# Patient Record
Sex: Female | Born: 1970 | Race: White | Hispanic: No | Marital: Married | Smoking: Former smoker
Health system: Southern US, Community
[De-identification: ages and names within clinical notes are randomized; demographics above are authoritative.]

## PROBLEM LIST (undated history)

## (undated) HISTORY — PX: CHOLECYSTECTOMY: SHX55

## (undated) HISTORY — PX: COSMETIC SURGERY: SHX468

---

## 2010-01-13 ENCOUNTER — Ambulatory Visit: Admission: RE | Admit: 2010-01-13 | Discharge: 2010-01-13 | Payer: Self-pay | Admitting: Family Medicine

## 2010-01-13 ENCOUNTER — Ambulatory Visit: Payer: Self-pay | Admitting: Vascular Surgery

## 2010-01-13 ENCOUNTER — Encounter (INDEPENDENT_AMBULATORY_CARE_PROVIDER_SITE_OTHER): Payer: Self-pay | Admitting: Family Medicine

## 2015-05-06 ENCOUNTER — Ambulatory Visit: Payer: Self-pay | Admitting: Women's Health

## 2015-05-12 ENCOUNTER — Encounter: Payer: Self-pay | Admitting: Women's Health

## 2015-05-12 ENCOUNTER — Other Ambulatory Visit (HOSPITAL_COMMUNITY)
Admission: RE | Admit: 2015-05-12 | Discharge: 2015-05-12 | Disposition: A | Discharge status: Home / Self Care | Payer: BLUE CROSS/BLUE SHIELD | Source: Ambulatory Visit | Attending: Women's Health | Admitting: Women's Health

## 2015-05-12 ENCOUNTER — Ambulatory Visit (INDEPENDENT_AMBULATORY_CARE_PROVIDER_SITE_OTHER): Payer: BLUE CROSS/BLUE SHIELD | Admitting: Women's Health

## 2015-05-12 VITALS — BP 140/80 | Ht 63.0 in | Wt >= 6400 oz

## 2015-05-12 DIAGNOSIS — Z01419 Encounter for gynecological examination (general) (routine) without abnormal findings: Secondary | ICD-10-CM

## 2015-05-12 DIAGNOSIS — Z1151 Encounter for screening for human papillomavirus (HPV): Secondary | ICD-10-CM | POA: Insufficient documentation

## 2015-05-12 DIAGNOSIS — N898 Other specified noninflammatory disorders of vagina: Secondary | ICD-10-CM | POA: Diagnosis not present

## 2015-05-12 LAB — WET PREP FOR TRICH, YEAST, CLUE
CLUE CELLS WET PREP: NONE SEEN
Trich, Wet Prep: NONE SEEN
YEAST WET PREP: NONE SEEN

## 2015-05-12 NOTE — Addendum Note (Signed)
Addended by: Dayna Barker on: 05/12/2015 04:10 PM   Modules accepted: Orders

## 2015-05-12 NOTE — Progress Notes (Signed)
Carrie Herring [birth year] [id number]    History:    Presents for annual exam.  Monthly cycle using no contraception without pregnancy. Carrie Herring extensively with job. Has an apartment here in [place], permanent home in [place]. Minimal healthcare in the past few years. Has not had a mammogram. Reports normal Paps.  Past medical history, past surgical history, family history and social history were all reviewed and documented in the EPIC chart. 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 Has been healthy.  ROS:  A ROS was performed and pertinent positives and negatives are included.  Exam:  Filed Vitals:   05/12/15 1157  BP: 140/80    General appearance:  Normal Thyroid:  Symmetrical, normal in size, without palpable masses or nodularity. Respiratory  Auscultation:  Clear without wheezing or rhonchi Cardiovascular  Auscultation:  Regular rate, without rubs, murmurs or gallops  Edema/varicosities:  Not grossly evident Abdominal  Soft,nontender, without masses, guarding or rebound.  Liver/spleen:  No organomegaly noted  Hernia:  None appreciated  Skin  Inspection:  Grossly normal   Breasts: Examined lying and sitting.     Right: Without masses, retractions, discharge or axillary adenopathy.     Left: Without masses, retractions, discharge or axillary adenopathy. Gentitourinary   Inguinal/mons:  Normal without inguinal adenopathy  External genitalia:  Normal, small superficial folliculitis at introitus, will watch at this time  BUS/Urethra/Skene's glands:  Normal  Vagina:  Normal  Cervix:  Normal  Uterus:   normal in size, shape and contour.  Midline and mobile limited exam/morbid obesity  Adnexa/parametria:     Rt: Without masses or tenderness.   Lt: Without masses or tenderness.  Anus and perineum: Normal  Digital rectal exam: Normal sphincter tone without palpated masses or tenderness  Assessment/Plan:  44 y.o. Carrie Herring G0 for annual exam.    Monthly cycle/no  contraception/infrequent intercourse Morbid obesity Borderline blood pressure  Plan: SBE's, annual screening mammogram, breast center information given instructed to schedule, reviewed importance of annual screen. Reviewed importance of weight loss for health. Weight Watchers reviewed and encouraged, also discussed bariatric surgery which she declines. Daily walking, calcium rich diet, vitamin D 1000 daily encouraged. Request to return to office for fasting labs, CBC, lipid panel, CMP, hemoglobin A1c, TSH, vitamin D, UA, Pap with HR HPV typing. New screening guidelines reviewed. Declines contraception. Instructed to recheck blood pressure at employee health and if continues greater than 130/80 follow-up with primary care.    Carrie Herring WHNP, 1:27 PM 05/12/2015

## 2015-05-12 NOTE — Patient Instructions (Signed)
Diabetes Mellitus and Food It is important for you to manage your blood sugar (glucose) level. Your blood glucose level can be greatly affected by what you eat. Eating healthier foods in the appropriate amounts throughout the day at about the same time each day will help you control your blood glucose level. It can also help slow or prevent worsening of your diabetes mellitus. Healthy eating may even help you improve the level of your blood pressure and reach or maintain a healthy weight.  General recommendations for healthful eating and cooking habits include:  Eating meals and snacks regularly. Avoid going long periods of time without eating to lose weight.  Eating a diet that consists mainly of plant-based foods, such as fruits, vegetables, nuts, legumes, and whole grains.  Using low-heat cooking methods, such as baking, instead of high-heat cooking methods, such as deep frying. Work with your dietitian to make sure you understand how to use the Nutrition Facts information on food labels. HOW CAN FOOD AFFECT ME? Carbohydrates Carbohydrates affect your blood glucose level more than any other type of food. Your dietitian will help you determine how many carbohydrates to eat at each meal and teach you how to count carbohydrates. Counting carbohydrates is important to keep your blood glucose at a healthy level, especially if you are using insulin or taking certain medicines for diabetes mellitus. Alcohol Alcohol can cause sudden decreases in blood glucose (hypoglycemia), especially if you use insulin or take certain medicines for diabetes mellitus. Hypoglycemia can be a life-threatening condition. Symptoms of hypoglycemia (sleepiness, dizziness, and disorientation) are similar to symptoms of having too much alcohol.  If your health care provider has given you approval to drink alcohol, do so in moderation and use the following guidelines:  Women should not have more than one drink per day, and men  should not have more than two drinks per day. One drink is equal to:  12 oz of beer.  5 oz of wine.  1 oz of hard liquor.  Do not drink on an empty stomach.  Keep yourself hydrated. Have water, diet soda, or unsweetened iced tea.  Regular soda, juice, and other mixers might contain a lot of carbohydrates and should be counted. WHAT FOODS ARE NOT RECOMMENDED? As you make food choices, it is important to remember that all foods are not the same. Some foods have fewer nutrients per serving than other foods, even though they might have the same number of calories or carbohydrates. It is difficult to get your body what it needs when you eat foods with fewer nutrients. Examples of foods that you should avoid that are high in calories and carbohydrates but low in nutrients include:  Trans fats (most processed foods list trans fats on the Nutrition Facts label).  Regular soda.  Juice.  Candy.  Sweets, such as cake, pie, doughnuts, and cookies.  Fried foods. WHAT FOODS CAN I EAT? Eat nutrient-rich foods, which will nourish your body and keep you healthy. The food you should eat also will depend on several factors, including:  The calories you need.  The medicines you take.  Your weight.  Your blood glucose level.  Your blood pressure level.  Your cholesterol level. You should eat a variety of foods, including:  Protein.  Lean cuts of meat.  Proteins low in saturated fats, such as fish, egg whites, and beans. Avoid processed meats.  Fruits and vegetables.  Fruits and vegetables that may help control blood glucose levels, such as apples, mangoes, and  yams.  Dairy products.  Choose fat-free or low-fat dairy products, such as milk, yogurt, and cheese.  Grains, bread, pasta, and rice.  Choose whole grain products, such as multigrain bread, whole oats, and brown rice. These foods may help control blood pressure.  Fats.  Foods containing healthful fats, such as nuts,  avocado, olive oil, canola oil, and fish. DOES EVERYONE WITH DIABETES MELLITUS HAVE THE SAME MEAL PLAN? Because every person with diabetes mellitus is different, there is not one meal plan that works for everyone. It is very important that you meet with a dietitian who will help you create a meal plan that is just right for you.   This information is not intended to replace advice given to you by your health care provider. Make sure you discuss any questions you have with your health care provider.   Document Released: 04/19/2005 Document Revised: 08/13/2014 Document Reviewed: 06/19/2013 Elsevier Interactive Patient Education 2016 Else[employer] Health Maintenance, Female Adopting a healthy lifestyle and getting preventive care can go a long way to promote health and wellness. Talk with your health care provider about what schedule of regular examinations is right for you. This is a good chance for you to check in with your provider about disease prevention and staying healthy. In between checkups, there are plenty of things you can do on your own. Experts have done a lot of research about which lifestyle changes and preventive measures are most likely to keep you healthy. Ask your health care provider for more information. WEIGHT AND DIET  Eat a healthy diet  Be sure to include plenty of vegetables, fruits, low-fat dairy products, and lean protein.  Do not eat a lot of foods high in solid fats, added sugars, or salt.  Get regular exercise. This is one of the most important things you can do for your health.  Most adults should exercise for at least 150 minutes each week. The exercise should increase your heart rate and make you sweat (moderate-intensity exercise).  Most adults should also do strengthening exercises at least twice a week. This is in addition to the moderate-intensity exercise.  Maintain a healthy weight  Body mass index (BMI) is a measurement that can be used to identify  possible weight problems. It estimates body fat based on height and weight. Your health care provider can help determine your BMI and help you achieve or maintain a healthy weight.  For females 20 years of age and older:   A BMI below 18.5 is considered underweight.  A BMI of 18.5 to 24.9 is normal.  A BMI of 25 to 29.9 is considered overweight.  A BMI of 30 and above is considered obese.  Watch levels of cholesterol and blood lipids  You should start having your blood tested for lipids and cholesterol at 44 years of age, then have this test every 5 years.  You may need to have your cholesterol levels checked more often if:  Your lipid or cholesterol levels are high.  You are older than 44 years of age.  You are at high risk for heart disease.  CANCER SCREENING   Lung Cancer  Lung cancer screening is recommended for adults 55-80 years old who are at high risk for lung cancer because of a history of smoking.  A yearly low-dose CT scan of the lungs is recommended for people who:  Currently smoke.  Have quit within the past 15 years.  Have at least a 30-pack-year history of smoking. A   pack year is smoking an average of one pack of cigarettes a day for 1 year.  Yearly screening should continue until it has been 15 years since you quit.  Yearly screening should stop if you develop a health problem that would prevent you from having lung cancer treatment.  Breast Cancer  Practice breast self-awareness. This means understanding how your breasts normally appear and feel.  It also means doing regular breast self-exams. Let your health care provider know about any changes, no matter how small.  If you are in your 20s or 30s, you should have a clinical breast exam (CBE) by a health care provider every 1-3 years as part of a regular health exam.  If you are 28 or older, have a CBE every year. Also consider having a breast X-ray (mammogram) every year.  If you have a family  history of breast cancer, talk to your health care provider about genetic screening.  If you are at high risk for breast cancer, talk to your health care provider about having an MRI and a mammogram every year.  Breast cancer gene (BRCA) assessment is recommended for women who have family members with BRCA-related cancers. BRCA-related cancers include:  Breast.  Ovarian.  Tubal.  Peritoneal cancers.  Results of the assessment will determine the need for genetic counseling and BRCA1 and BRCA2 testing. Cervical Cancer Your health care provider may recommend that you be screened regularly for cancer of the pelvic organs (ovaries, uterus, and vagina). This screening involves a pelvic examination, including checking for microscopic changes to the surface of your cervix (Pap test). You may be encouraged to have this screening done every 3 years, beginning at age 2.  For women ages 88-65, health care providers may recommend pelvic exams and Pap testing every 3 years, or they may recommend the Pap and pelvic exam, combined with testing for human papilloma virus (HPV), every 5 years. Some types of HPV increase your risk of cervical cancer. Testing for HPV may also be done on women of any age with unclear Pap test results.  Other health care providers may not recommend any screening for nonpregnant women who are considered low risk for pelvic cancer and who do not have symptoms. Ask your health care provider if a screening pelvic exam is right for you.  If you have had past treatment for cervical cancer or a condition that could lead to cancer, you need Pap tests and screening for cancer for at least 20 years after your treatment. If Pap tests have been discontinued, your risk factors (such as having a new sexual partner) need to be reassessed to determine if screening should resume. Some women have medical problems that increase the chance of getting cervical cancer. In these cases, your health care  provider may recommend more frequent screening and Pap tests. Colorectal Cancer  This type of cancer can be detected and often prevented.  Routine colorectal cancer screening usually begins at 44 years of age and continues through 44 years of age.  Your health care provider may recommend screening at an earlier age if you have risk factors for colon cancer.  Your health care provider may also recommend using home test kits to check for hidden blood in the stool.  A small camera at the end of a tube can be used to examine your colon directly (sigmoidoscopy or colonoscopy). This is done to check for the earliest forms of colorectal cancer.  Routine screening usually begins at age 33.  Direct  examination of the colon should be repeated every 5-10 years through 44 years of age. However, you may need to be screened more often if early forms of precancerous polyps or small growths are found. Skin Cancer  Check your skin from head to toe regularly.  Tell your health care provider about any new moles or changes in moles, especially if there is a change in a mole's shape or color.  Also tell your health care provider if you have a mole that is larger than the size of a pencil eraser.  Always use sunscreen. Apply sunscreen liberally and repeatedly throughout the day.  Protect yourself by wearing long sleeves, pants, a wide-brimmed hat, and sunglasses whenever you are outside. HEART DISEASE, DIABETES, AND HIGH BLOOD PRESSURE   High blood pressure causes heart disease and increases the risk of stroke. High blood pressure is more likely to develop in:  People who have blood pressure in the high end of the normal range (130-139/85-89 mm Hg).  People who are overweight or obese.  People who are African American.  If you are 2-50 years of age, have your blood pressure checked every 3-5 years. If you are 61 years of age or older, have your blood pressure checked every year. You should have your  blood pressure measured twice--once when you are at a hospital or clinic, and once when you are not at a hospital or clinic. Record the average of the two measurements. To check your blood pressure when you are not at a hospital or clinic, you can use:  An automated blood pressure machine at a pharmacy.  A home blood pressure monitor.  If you are between 64 years and 25 years old, ask your health care provider if you should take aspirin to prevent strokes.  Have regular diabetes screenings. This involves taking a blood sample to check your fasting blood sugar level.  If you are at a normal weight and have a low risk for diabetes, have this test once every three years after 44 years of age.  If you are overweight and have a high risk for diabetes, consider being tested at a younger age or more often. PREVENTING INFECTION  Hepatitis B  If you have a higher risk for hepatitis B, you should be screened for this virus. You are considered at high risk for hepatitis B if:  You were born in a country where hepatitis B is common. Ask your health care provider which countries are considered high risk.  Your parents were born in a high-risk country, and you have not been immunized against hepatitis B (hepatitis B vaccine).  You have HIV or AIDS.  You use needles to inject street drugs.  You live with someone who has hepatitis B.  You have had sex with someone who has hepatitis B.  You get hemodialysis treatment.  You take certain medicines for conditions, including cancer, organ transplantation, and autoimmune conditions. Hepatitis C  Blood testing is recommended for:  Everyone born from [birth year] through 1965.  Anyone with known risk factors for hepatitis C. Sexually transmitted infections (STIs)  You should be screened for sexually transmitted infections (STIs) including gonorrhea and chlamydia if:  You are sexually active and are younger than 44 years of age.  You are older than 44  years of age and your health care provider tells you that you are at risk for this type of infection.  Your sexual activity has changed since you were last screened and you are at  an increased risk for chlamydia or gonorrhea. Ask your health care provider if you are at risk.  If you do not have HIV, but are at risk, it may be recommended that you take a prescription medicine daily to prevent HIV infection. This is called pre-exposure prophylaxis (PrEP). You are considered at risk if:  You are sexually active and do not regularly use condoms or know the HIV status of your partner(s).  You take drugs by injection.  You are sexually active with a partner who has HIV. Talk with your health care provider about whether you are at high risk of being infected with HIV. If you choose to begin PrEP, you should first be tested for HIV. You should then be tested every 3 months for as long as you are taking PrEP.  PREGNANCY   If you are premenopausal and you may become pregnant, ask your health care provider about preconception counseling.  If you may become pregnant, take 400 to 800 micrograms (mcg) of folic acid every day.  If you want to prevent pregnancy, talk to your health care provider about birth control (contraception). OSTEOPOROSIS AND MENOPAUSE   Osteoporosis is a disease in which the bones lose minerals and strength with aging. This can result in serious bone fractures. Your risk for osteoporosis can be identified using a bone density scan.  If you are 94 years of age or older, or if you are at risk for osteoporosis and fractures, ask your health care provider if you should be screened.  Ask your health care provider whether you should take a calcium or vitamin D supplement to lower your risk for osteoporosis.  Menopause may have certain physical symptoms and risks.  Hormone replacement therapy may reduce some of these symptoms and risks. Talk to your health care provider about whether  hormone replacement therapy is right for you.  HOME CARE INSTRUCTIONS   Schedule regular health, dental, and eye exams.  Stay current with your immunizations.   Do not use any tobacco products including cigarettes, chewing tobacco, or electronic cigarettes.  If you are pregnant, do not drink alcohol.  If you are breastfeeding, limit how much and how often you drink alcohol.  Limit alcohol intake to no more than 1 drink per day for nonpregnant women. One drink equals 12 ounces of beer, 5 ounces of wine, or 1 ounces of hard liquor.  Do not use street drugs.  Do not share needles.  Ask your health care provider for help if you need support or information about quitting drugs.  Tell your health care provider if you often feel depressed.  Tell your health care provider if you have ever been abused or do not feel safe at home.   This information is not intended to replace advice given to you by your health care provider. Make sure you discuss any questions you have with your health care provider.   Document Released: 02/05/2011 Document Revised: 08/13/2014 Document Reviewed: 06/24/2013 Elsevier Interactive Patient Education [employer].

## 2015-05-13 LAB — URINALYSIS W MICROSCOPIC + REFLEX CULTURE
BACTERIA UA: NONE SEEN [HPF]
BILIRUBIN URINE: NEGATIVE
CASTS: NONE SEEN [LPF]
CRYSTALS: NONE SEEN [HPF]
Glucose, UA: NEGATIVE
HGB URINE DIPSTICK: NEGATIVE
KETONES UR: NEGATIVE
Nitrite: NEGATIVE
PROTEIN: NEGATIVE
Specific Gravity, Urine: 1.021 (ref 1.001–1.035)
Yeast: NONE SEEN [HPF]
pH: 7 (ref 5.0–8.0)

## 2015-05-16 LAB — URINE CULTURE

## 2015-05-17 LAB — CYTOLOGY - PAP

## 2016-08-30 ENCOUNTER — Encounter (HOSPITAL_COMMUNITY): Payer: Self-pay | Admitting: Emergency Medicine

## 2016-08-30 ENCOUNTER — Emergency Department (HOSPITAL_COMMUNITY): Payer: BLUE CROSS/BLUE SHIELD

## 2016-08-30 ENCOUNTER — Emergency Department (HOSPITAL_COMMUNITY)
Admission: EM | Admit: 2016-08-30 | Discharge: 2016-08-30 | Disposition: A | Discharge status: Home / Self Care | Payer: BLUE CROSS/BLUE SHIELD | Attending: Emergency Medicine | Admitting: Emergency Medicine

## 2016-08-30 DIAGNOSIS — Z87891 Personal history of nicotine dependence: Secondary | ICD-10-CM | POA: Insufficient documentation

## 2016-08-30 DIAGNOSIS — Z79899 Other long term (current) drug therapy: Secondary | ICD-10-CM | POA: Insufficient documentation

## 2016-08-30 DIAGNOSIS — R42 Dizziness and giddiness: Secondary | ICD-10-CM | POA: Insufficient documentation

## 2016-08-30 LAB — URINALYSIS, ROUTINE W REFLEX MICROSCOPIC
BILIRUBIN URINE: NEGATIVE
Glucose, UA: NEGATIVE mg/dL
Hgb urine dipstick: NEGATIVE
KETONES UR: NEGATIVE mg/dL
Nitrite: NEGATIVE
PROTEIN: NEGATIVE mg/dL
Specific Gravity, Urine: 1.026 (ref 1.005–1.030)
pH: 5 (ref 5.0–8.0)

## 2016-08-30 LAB — CBC WITH DIFFERENTIAL/PLATELET
Basophils Absolute: 0 10*3/uL (ref 0.0–0.1)
Basophils Relative: 1 %
EOS ABS: 0.2 10*3/uL (ref 0.0–0.7)
Eosinophils Relative: 2 %
HCT: 40.8 % (ref 36.0–46.0)
HEMOGLOBIN: 13.7 g/dL (ref 12.0–15.0)
LYMPHS ABS: 2.6 10*3/uL (ref 0.7–4.0)
LYMPHS PCT: 34 %
MCH: 29.8 pg (ref 26.0–34.0)
MCHC: 33.6 g/dL (ref 30.0–36.0)
MCV: 88.9 fL (ref 78.0–100.0)
Monocytes Absolute: 0.6 10*3/uL (ref 0.1–1.0)
Monocytes Relative: 8 %
NEUTROS PCT: 55 %
Neutro Abs: 4.2 10*3/uL (ref 1.7–7.7)
Platelets: 411 10*3/uL — ABNORMAL HIGH (ref 150–400)
RBC: 4.59 MIL/uL (ref 3.87–5.11)
RDW: 14.1 % (ref 11.5–15.5)
WBC: 7.6 10*3/uL (ref 4.0–10.5)

## 2016-08-30 LAB — BASIC METABOLIC PANEL
Anion gap: 8 (ref 5–15)
BUN: 14 mg/dL (ref 6–20)
CHLORIDE: 102 mmol/L (ref 101–111)
CO2: 26 mmol/L (ref 22–32)
CREATININE: 0.85 mg/dL (ref 0.44–1.00)
Calcium: 8.9 mg/dL (ref 8.9–10.3)
GFR calc non Af Amer: >60 mL/min (ref >60)
Glucose, Bld: 106 mg/dL — ABNORMAL HIGH (ref 65–99)
POTASSIUM: 3.7 mmol/L (ref 3.5–5.1)
SODIUM: 136 mmol/L (ref 135–145)

## 2016-08-30 LAB — PREGNANCY, URINE: PREG TEST UR: NEGATIVE

## 2016-08-30 LAB — CBG MONITORING, ED: Glucose-Capillary: 90 mg/dL (ref 65–99)

## 2016-08-30 MED ORDER — ONDANSETRON 4 MG PO TBDP: 4.0000 mg | ORAL_TABLET | Freq: Three times a day (TID) | ORAL | 0 refills | Status: AC | PRN

## 2016-08-30 MED ORDER — MECLIZINE HCL 25 MG PO TABS: 25.0000 mg | ORAL_TABLET | Freq: Three times a day (TID) | ORAL | 0 refills | Status: AC | PRN

## 2016-08-30 NOTE — ED Triage Notes (Addendum)
Pt reports she woke up this am with dizziness (room spinning sensation). Vertigo worse with movement. Had n/v/d earlier in the week, but has tolerated food and drink since day before yesterday. No near syncope or lightheadedness.

## 2016-08-30 NOTE — Discharge Instructions (Signed)
BP needs to be rechecked by pcp

## 2016-08-30 NOTE — ED Provider Notes (Signed)
[facility]-EMERGENCY DEPT Provider Note   CSN: [id number] Arrival date & time: 08/30/16  [id number]     History   Chief Complaint Chief Complaint  Patient presents with  . Dizziness    HPI Carrie Herring is a 46 y.o. female.  Pt presents to the ED today with dizziness and ambulatory dysfunction.  The pt woke up in the middle of the night on the 20th.  She has been on and off feeling ok.  This morning, she woke up very dizzy.  She said that she tried to walk and felt very unstable.  She said she was running into the wall.  The pt said it did not get better, so she called her friend to bring her here.  The pt waited for a long time in the waiting room and now feels better.  She does still have a little headache.      History reviewed. No pertinent past medical history.  Patient Active Problem List   Diagnosis Date Noted  . Morbid obesity (HCC) 05/12/2015    Past Surgical History:  Procedure Laterality Date  . CHOLECYSTECTOMY    . COSMETIC SURGERY     Eye lids    OB History    Gravida Para Term Preterm AB Living   0 0 0 0 0 0   SAB TAB Ectopic Multiple Live Births   0 0 0 0         Home Medications    Prior to Admission medications   Medication Sig Start Date End Date Taking? Authorizing Provider  meclizine (ANTIVERT) 25 MG tablet Take 1 tablet (25 mg total) by mouth 3 (three) times daily as needed for dizziness. 08/30/16   Jacalyn LefevreJulie Lorenzo Pereyra, MD  ondansetron (ZOFRAN ODT) 4 MG disintegrating tablet Take 1 tablet (4 mg total) by mouth every 8 (eight) hours as needed for nausea or vomiting. 08/30/16   Jacalyn LefevreJulie Kaydenn Mclear, MD    Family History Family History  Problem Relation Age of Onset  . Adopted: Yes  . Stroke Mother     Social History Social History  Substance Use Topics  . Smoking status: Former Games developermoker  . Smokeless tobacco: Not on file  . Alcohol use 0.0 oz/week     Comment: Rare     Allergies   Citrus   Review of Systems Review of Systems  Neurological:  Positive for dizziness.  All other systems reviewed and are negative.    Physical Exam Updated Vital Signs BP (!) 154/103 (BP Location: Left Arm)   Pulse 96   Temp 98.6 F (37 C) (Oral)   Resp (!) 28   LMP 08/06/2016   SpO2 100%   Physical Exam  Constitutional: She is oriented to person, place, and time. She appears well-developed and well-nourished.  HENT:  Head: Normocephalic and atraumatic.  Right Ear: External ear normal.  Left Ear: External ear normal.  Nose: Nose normal.  Mouth/Throat: Oropharynx is clear and moist.  Eyes: Conjunctivae and EOM are normal. Pupils are equal, round, and reactive to light.  Neck: Normal range of motion. Neck supple.  Cardiovascular: Normal rate, regular rhythm, normal heart sounds and intact distal pulses.   Pulmonary/Chest: Effort normal and breath sounds normal.  Abdominal: Soft. Bowel sounds are normal.  Musculoskeletal: Normal range of motion.  Neurological: She is alert and oriented to person, place, and time.  Skin: Skin is warm. Capillary refill takes less than 2 seconds.  Psychiatric: She has a normal mood and affect. Her behavior is normal.  Judgment and thought content normal.  Nursing note and vitals reviewed.    ED Treatments / Results  Labs (all labs ordered are listed, but only abnormal results are displayed) Labs Reviewed  BASIC METABOLIC PANEL - Abnormal; Notable for the following:       Result Value   Glucose, Bld 106 (*)    All other components within normal limits  CBC WITH DIFFERENTIAL/PLATELET - Abnormal; Notable for the following:    Platelets 411 (*)    All other components within normal limits  URINALYSIS, ROUTINE W REFLEX MICROSCOPIC - Abnormal; Notable for the following:    Leukocytes, UA TRACE (*)    Bacteria, UA MANY (*)    Squamous Epithelial / LPF 0-5 (*)    All other components within normal limits  PREGNANCY, URINE  CBG MONITORING, ED    EKG  EKG Interpretation  Date/Time:  Thursday August 30 2016 14:31:56 EST Ventricular Rate:  91 PR Interval:    QRS Duration: 85 QT Interval:  358 QTC Calculation: 441 R Axis:   49 Text Interpretation:  Sinus rhythm Confirmed by Omari Koslosky MD, Jenesis Suchy ([id number]) on 08/30/2016 2:35:13 PM       Radiology Ct Head Wo Contrast  Result Date: 08/30/2016 CLINICAL DATA:  Dizziness EXAM: CT HEAD WITHOUT CONTRAST TECHNIQUE: Contiguous axial images were obtained from the base of the skull through the vertex without intravenous contrast. COMPARISON:  None. FINDINGS: Brain: No acute intracranial abnormality. Specifically, no hemorrhage, hydrocephalus, mass lesion, acute infarction, or significant intracranial injury. Vascular: No hyperdense vessel or unexpected calcification. Skull: No acute calvarial abnormality. Sinuses/Orbits: Visualized paranasal sinuses and mastoids clear. Orbital soft tissues unremarkable. Other: None IMPRESSION: Normal study. Electronically Signed   By: Charlett Nose M.D.   On: 08/30/2016 14:20    Procedures Procedures (including critical care time)  Medications Ordered in ED Medications - No data to display   Initial Impression / Assessment and Plan / ED Course  I have reviewed the triage vital signs and the nursing notes.  Pertinent labs & imaging results that were available during my care of the patient were reviewed by me and considered in my medical decision making (see chart for details).    Pt is still feeling ok.  She looks good.  She is instructed to f/u with her pcp for a recheck of her bp as it was high here.  She knows to return if worse.  Final Clinical Impressions(s) / ED Diagnoses   Final diagnoses:  Vertigo    New Prescriptions New Prescriptions   MECLIZINE (ANTIVERT) 25 MG TABLET    Take 1 tablet (25 mg total) by mouth 3 (three) times daily as needed for dizziness.   ONDANSETRON (ZOFRAN ODT) 4 MG DISINTEGRATING TABLET    Take 1 tablet (4 mg total) by mouth every 8 (eight) hours as needed for nausea or  vomiting.     Jacalyn Lefevre, MD 08/30/16 1515

## 2020-03-07 ENCOUNTER — Other Ambulatory Visit (HOSPITAL_BASED_OUTPATIENT_CLINIC_OR_DEPARTMENT_OTHER): Payer: Self-pay | Admitting: Family Medicine

## 2020-03-07 ENCOUNTER — Ambulatory Visit: Payer: Self-pay

## 2020-03-07 DIAGNOSIS — R600 Localized edema: Secondary | ICD-10-CM

## 2020-03-07 NOTE — Telephone Encounter (Signed)
Patient called stating that she has swelling in her left leg and tingling in her toes.  She states that the swelling oes above her knee just  left side.  She states it feel warm but is not red. She has had some swelling to her feet at times and states that she is overweight.  She states that she has not been to see a physician for several years and her old office suggested UC or ER for evaluation. Per protocol patient was advised that she should be seen today. UC or ER.  She knows that UC will not do imaging that may be needed.She agrees to go to be seen. Unsure where she will start. UC or ER.   Reason for Disposition . SEVERE leg swelling (e.g., swelling extends above knee, entire leg is swollen, weeping fluid)  Answer Assessment - Initial Assessment Questions 1. ONSET: "When did the swelling start?" (e.g., minutes, hours, days)     Not  2. LOCATION: "What part of the leg is swollen?"  "Are both legs swollen or just one leg?"     Left leg toes tingly red heat to area 3. SEVERITY: "How bad is the swelling?" (e.g., localized; mild, moderate, severe)  - Localized - small area of swelling localized to one leg  - MILD pedal edema - swelling limited to foot and ankle, pitting edema < 1/4 inch (6 mm) deep, rest and elevation eliminate most or all swelling  - MODERATE edema - swelling of lower leg to knee, pitting edema > 1/4 inch (6 mm) deep, rest and elevation only partially reduce swelling  - SEVERE edema - swelling extends above knee, facial or hand swelling present     Tingling tightness tightness just above calf Swelling above knee 4. REDNESS: "Does the swelling look red or infected?"     no 5. PAIN: "Is the swelling painful to touch?" If Yes, ask: "How painful is it?"   (Scale 1-10; mild, moderate or severe)     No pain just tingles 6-7 but not pain 6. FEVER: "Do you have a fever?" If Yes, ask: "What is it, how was it measured, and when did it start?"      no 7. CAUSE: "What do you think is  causing the leg swelling?"     Unsure but possible blood clot 8. MEDICAL HISTORY: "Do you have a history of heart failure, kidney disease, liver failure, or cancer?"     no 9. RECURRENT SYMPTOM: "Have you had leg swelling before?" If Yes, ask: "When was the last time?" "What happened that time?"     Nothing like this 10. OTHER SYMPTOMS: "Do you have any other symptoms?" (e.g., chest pain, difficulty breathing)      no 11. PREGNANCY: "Is there any chance you are pregnant?" "When was your last menstrual period?"       No menses 3 weeks ago  Protocols used: LEG SWELLING AND EDEMA-A-AH

## 2020-03-08 ENCOUNTER — Other Ambulatory Visit: Payer: Self-pay

## 2020-03-08 ENCOUNTER — Ambulatory Visit (HOSPITAL_BASED_OUTPATIENT_CLINIC_OR_DEPARTMENT_OTHER)
Admission: RE | Admit: 2020-03-08 | Discharge: 2020-03-08 | Disposition: A | Discharge status: Home / Self Care | Payer: No Typology Code available for payment source | Source: Ambulatory Visit | Attending: Family Medicine | Admitting: Family Medicine

## 2020-03-08 ENCOUNTER — Ambulatory Visit (HOSPITAL_BASED_OUTPATIENT_CLINIC_OR_DEPARTMENT_OTHER): Payer: No Typology Code available for payment source

## 2020-03-08 DIAGNOSIS — R600 Localized edema: Secondary | ICD-10-CM | POA: Insufficient documentation

## 2020-03-09 ENCOUNTER — Other Ambulatory Visit (HOSPITAL_BASED_OUTPATIENT_CLINIC_OR_DEPARTMENT_OTHER): Payer: Self-pay

## 2020-12-26 ENCOUNTER — Other Ambulatory Visit: Payer: Self-pay

## 2020-12-26 ENCOUNTER — Emergency Department (HOSPITAL_BASED_OUTPATIENT_CLINIC_OR_DEPARTMENT_OTHER): Payer: No Typology Code available for payment source

## 2020-12-26 ENCOUNTER — Emergency Department (HOSPITAL_BASED_OUTPATIENT_CLINIC_OR_DEPARTMENT_OTHER)
Admission: EM | Admit: 2020-12-26 | Discharge: 2020-12-26 | Disposition: A | Discharge status: Home / Self Care | Payer: No Typology Code available for payment source | Attending: Emergency Medicine | Admitting: Emergency Medicine

## 2020-12-26 ENCOUNTER — Encounter (HOSPITAL_BASED_OUTPATIENT_CLINIC_OR_DEPARTMENT_OTHER): Payer: Self-pay

## 2020-12-26 DIAGNOSIS — Z87891 Personal history of nicotine dependence: Secondary | ICD-10-CM | POA: Diagnosis not present

## 2020-12-26 DIAGNOSIS — M79605 Pain in left leg: Secondary | ICD-10-CM | POA: Diagnosis present

## 2020-12-26 MED ORDER — DOXYCYCLINE HYCLATE 100 MG PO CAPS: 100.0000 mg | ORAL_CAPSULE | Freq: Two times a day (BID) | ORAL | 0 refills | Status: AC

## 2020-12-26 NOTE — ED Triage Notes (Signed)
Pt c/o left LE pain/swelling x 4-5 days-denies injury

## 2020-12-26 NOTE — Discharge Instructions (Signed)
You were seen in the emergency room today with left leg pain and some swelling.  We did not find evidence of a blood clot in your left leg with the ultrasound.  There is an approximately 5 cm area of fluid in the back of your lower left thigh.  I do not see any sign of infection here.  When you establish care with a primary care doctor they may reevaluate this area and consider referring you to an orthopedist if it begins to cause you discomfort.  If the area in front of your leg becomes more red, tender, swollen, or you develop fevers you can start the antibiotic.  Otherwise, please use warm compress and Tylenol/Motrin as needed for any pain.

## 2020-12-26 NOTE — ED Notes (Signed)
Pt reports no injury, pain anterior and posterior leg.  Slight redness noted to anterior lower lg.

## 2020-12-26 NOTE — ED Provider Notes (Signed)
Emergency Department Provider Note   I have reviewed the triage vital signs and the nursing notes.   HISTORY  Chief Complaint Leg Pain   HPI Carrie Herring is a 50 y.o. female with past medical history reviewed below presents to the emergency department for evaluation of left leg pain and some swelling.  She has no prior history of DVT.  She is not having chest pain or shortness of breath.  She is noticed swelling in the leg  over the past 4 to 5 days.  She denies injury.  She is feeling some anterior leg swelling but also feeling a muscle cramping type pain behind the left leg.  She states that she has been eating more healthy but continues to struggle to lose weight.  She denies any focal area of redness, fever, warmth.    History reviewed. No pertinent past medical history.  Patient Active Problem List   Diagnosis Date Noted  . Morbid obesity (HCC) 05/12/2015    Past Surgical History:  Procedure Laterality Date  . CHOLECYSTECTOMY    . COSMETIC SURGERY     Eye lids    Allergies Citrus  Family History  Adopted: Yes  Problem Relation Age of Onset  . Stroke Mother     Social History Social History   Tobacco Use  . Smoking status: Former Smoker  Substance Use Topics  . Alcohol use: Yes    Alcohol/week: 0.0 standard drinks    Comment: Rare  . Drug use: No    Review of Systems  Constitutional: No fever/chills. Cardiovascular: Denies chest pain. Respiratory: Denies shortness of breath. Musculoskeletal: Negative for back pain. Positive left leg pain.  Skin: Negative for rash. Neurological: Negative for focal weakness or numbness.   ____________________________________________   PHYSICAL EXAM:  VITAL SIGNS: ED Triage Vitals  Enc Vitals Group     BP 12/26/20 1406 (!) 161/96     Pulse Rate 12/26/20 1406 (!) 118     Resp 12/26/20 1406 20     Temp 12/26/20 1406 98.8 F (37.1 C)     Temp Source 12/26/20 1406 Oral     SpO2 12/26/20 1406 95 %      Weight 12/26/20 1409 (!) 461 lb (209.1 kg)     Height 12/26/20 1409 5\' 4"  (1.626 m)   Constitutional: Alert and oriented. Well appearing and in no acute distress. Eyes: Conjunctivae are normal.  Head: Atraumatic. Nose: No congestion/rhinnorhea. Mouth/Throat: Mucous membranes are moist.  Neck: No stridor.   Cardiovascular: Tachycardia.  Respiratory: Normal respiratory effort.  Gastrointestinal: No distention.  Musculoskeletal: No lower extremity tenderness.  There is some edema noted to the left leg in comparison to the right.  No erythema or focal area of warmth, abscess, cellulitis. No gross deformities of extremities. Neurologic:  Normal speech and language. No gross focal neurologic deficits are appreciated.  Skin:  Skin is warm, dry and intact. No rash noted.  ____________________________________________  RADIOLOGY  Venous Img Lower  Left (DVT Study)  Result Date: 12/26/2020 CLINICAL DATA:  Left calf swelling. EXAM: LEFT LOWER EXTREMITY VENOUS DOPPLER ULTRASOUND TECHNIQUE: Gray-scale sonography with compression, as well as color and duplex ultrasound, were performed to evaluate the deep venous system(s) from the level of the common femoral vein through the popliteal and proximal calf veins. COMPARISON:  None. FINDINGS: VENOUS Normal compressibility of the common femoral, superficial femoral, and popliteal veins, as well as the visualized calf veins. Visualized portions of profunda femoral vein and great saphenous vein  unremarkable. No filling defects to suggest DVT on grayscale or color Doppler imaging. Doppler waveforms show normal direction of venous flow, normal respiratory plasticity and response to augmentation. Limited views of the contralateral common femoral vein are unremarkable. OTHER Approximately 5.2 x 2.6 x 3.1 cm complex fluid collection in the posterior and inferior thigh. No appreciable flow on Doppler imaging. Limitations: Limited evaluation of the calf veins due to  patient body habitus. IMPRESSION: 1. No evidence of DVT with limited visualization of the calf veins due to patient body habitus. 2. Nonspecific 5.2 cm complex fluid collection in the posterior and inferior thigh. Electronically Signed   By: Feliberto Harts MD   On: 12/26/2020 17:34    ____________________________________________   PROCEDURES  Procedure(s) performed:   Procedures  None  ____________________________________________   INITIAL IMPRESSION / ASSESSMENT AND PLAN / ED COURSE  Pertinent labs & imaging results that were available during my care of the patient were reviewed by me and considered in my medical decision making (see chart for details).   Patient presents to the emergency department unilateral leg swelling with some mild pain over the past 4 to 5 days.  She denied having chest pain or shortness of breath to suspect PE.  She does arrive tachycardic.  Seems well-perfused and not having any chest discomfort.  No area of cellulitis, abscess, other explanation for her symptoms.  Obesity is a risk factor for DVT and I will obtain a ultrasound of the left lower extremity to evaluate for this.   05:45 PM  Patient reevaluated after ultrasound results.  The study was limited but no evidence of DVT was found.  There is an approximately 5 x 3 cm fluid collection in the posterior/inferior thigh on ultrasound.  I evaluated this area at the bedside.  There is no erythema or palpable mass on my exam.  This does not appear to be an abscess which requires drainage.  Is not causing the patient any discomfort in that area.  We discussed establishing care with a primary care doctor and mentioning it to them.  If it begins to cause discomfort or seems to be enlarging on follow-up ultrasound they may consider referral to orthopedics for drainage.   Provided a watch and wait prescription for doxycycline in case the area of tenderness in the front of the leg becomes more cellulitic.  We  discussed signs/symptoms which would prompt her to start the antibiotics.  Otherwise, she will do warm compresses and take over-the-counter medications for pain.  Discussed ED return precautions for worsening symptoms, chest pain, shortness of breath. ____________________________________________  FINAL CLINICAL IMPRESSION(S) / ED DIAGNOSES  Final diagnoses:  Left leg pain    NEW OUTPATIENT MEDICATIONS STARTED DURING THIS VISIT:  New Prescriptions   DOXYCYCLINE (VIBRAMYCIN) 100 MG CAPSULE    Take 1 capsule (100 mg total) by mouth 2 (two) times daily for 7 days.    Note:  This document was prepared using Dragon voice recognition software and may include unintentional dictation errors.  Alona Bene, MD, [facility]    [place] Sorn, Arlyss Repress, MD 12/26/20 [phone]

## 2021-03-27 ENCOUNTER — Encounter (HOSPITAL_BASED_OUTPATIENT_CLINIC_OR_DEPARTMENT_OTHER): Payer: Self-pay | Admitting: Emergency Medicine

## 2021-03-27 ENCOUNTER — Emergency Department (HOSPITAL_BASED_OUTPATIENT_CLINIC_OR_DEPARTMENT_OTHER)
Admission: EM | Admit: 2021-03-27 | Discharge: 2021-03-27 | Disposition: A | Discharge status: Home / Self Care | Payer: No Typology Code available for payment source | Attending: Emergency Medicine | Admitting: Emergency Medicine

## 2021-03-27 ENCOUNTER — Emergency Department (HOSPITAL_BASED_OUTPATIENT_CLINIC_OR_DEPARTMENT_OTHER): Payer: No Typology Code available for payment source

## 2021-03-27 ENCOUNTER — Other Ambulatory Visit: Payer: Self-pay

## 2021-03-27 DIAGNOSIS — R1033 Periumbilical pain: Secondary | ICD-10-CM | POA: Diagnosis present

## 2021-03-27 DIAGNOSIS — K439 Ventral hernia without obstruction or gangrene: Secondary | ICD-10-CM | POA: Insufficient documentation

## 2021-03-27 DIAGNOSIS — Z87891 Personal history of nicotine dependence: Secondary | ICD-10-CM | POA: Diagnosis not present

## 2021-03-27 LAB — COMPREHENSIVE METABOLIC PANEL
ALT: 17 U/L (ref 0–44)
AST: 21 U/L (ref 15–41)
Albumin: 4.1 g/dL (ref 3.5–5.0)
Alkaline Phosphatase: 56 U/L (ref 38–126)
Anion gap: 10 (ref 5–15)
BUN: 18 mg/dL (ref 6–20)
CO2: 25 mmol/L (ref 22–32)
Calcium: 9.1 mg/dL (ref 8.9–10.3)
Chloride: 100 mmol/L (ref 98–111)
Creatinine, Ser: 0.86 mg/dL (ref 0.44–1.00)
GFR, Estimated: >60 mL/min (ref >60)
Glucose, Bld: 110 mg/dL — ABNORMAL HIGH (ref 70–99)
Potassium: 3.9 mmol/L (ref 3.5–5.1)
Sodium: 135 mmol/L (ref 135–145)
Total Bilirubin: 0.8 mg/dL (ref 0.3–1.2)
Total Protein: 8.4 g/dL — ABNORMAL HIGH (ref 6.5–8.1)

## 2021-03-27 LAB — CBC WITH DIFFERENTIAL/PLATELET
Abs Immature Granulocytes: 0.03 10*3/uL (ref 0.00–0.07)
Basophils Absolute: 0 10*3/uL (ref 0.0–0.1)
Basophils Relative: 1 %
Eosinophils Absolute: 0.2 10*3/uL (ref 0.0–0.5)
Eosinophils Relative: 3 %
HCT: 36.6 % (ref 36.0–46.0)
Hemoglobin: 11.9 g/dL — ABNORMAL LOW (ref 12.0–15.0)
Immature Granulocytes: 1 %
Lymphocytes Relative: 23 %
Lymphs Abs: 1.5 10*3/uL (ref 0.7–4.0)
MCH: 28.5 pg (ref 26.0–34.0)
MCHC: 32.5 g/dL (ref 30.0–36.0)
MCV: 87.8 fL (ref 80.0–100.0)
Monocytes Absolute: 0.6 10*3/uL (ref 0.1–1.0)
Monocytes Relative: 9 %
Neutro Abs: 4.1 10*3/uL (ref 1.7–7.7)
Neutrophils Relative %: 63 %
Platelets: 396 10*3/uL (ref 150–400)
RBC: 4.17 MIL/uL (ref 3.87–5.11)
RDW: 15 % (ref 11.5–15.5)
WBC: 6.4 10*3/uL (ref 4.0–10.5)
nRBC: 0 % (ref 0.0–0.2)

## 2021-03-27 LAB — LIPASE, BLOOD: Lipase: 28 U/L (ref 11–51)

## 2021-03-27 NOTE — ED Provider Notes (Signed)
[facility] EMERGENCY DEPARTMENT Provider Note   CSN: [id number] Arrival date & time: 03/27/21  5462     History Chief Complaint  Patient presents with   Abdominal Pain    Carrie Herring is a 50 y.o. female.  The history is provided by the patient.  Abdominal Pain Pain location:  Periumbilical Pain quality: aching   Pain radiates to:  Does not radiate Pain severity:  Mild Onset quality:  Gradual Duration:  2 days Timing:  Constant Progression:  Unchanged Chronicity:  New Context comment:  Feels like something sticing out of her abdomen the last few days, no n/v/d Relieved by:  Nothing Worsened by:  Nothing Associated symptoms: no anorexia, no belching, no chest pain, no chills, no constipation, no cough, no diarrhea, no dysuria, no fatigue, no fever, no flatus, no hematuria, no melena, no nausea, no shortness of breath, no sore throat, no vaginal bleeding, no vaginal discharge and no vomiting       History reviewed. No pertinent past medical history.  Patient Active Problem List   Diagnosis Date Noted   Morbid obesity (HCC) 05/12/2015    Past Surgical History:  Procedure Laterality Date   CHOLECYSTECTOMY     COSMETIC SURGERY     Eye lids     OB History     Gravida  0   Para  0   Term  0   Preterm  0   AB  0   Living  0      SAB  0   IAB  0   Ectopic  0   Multiple  0   Live Births              Family History  Adopted: Yes  Problem Relation Age of Onset   Stroke Mother     Social History   Tobacco Use   Smoking status: Former  Substance Use Topics   Alcohol use: Yes    Alcohol/week: 0.0 standard drinks    Comment: Rare   Drug use: No    Home Medications Prior to Admission medications   Medication Sig Start Date End Date Taking? Authorizing Provider  meclizine (ANTIVERT) 25 MG tablet Take 1 tablet (25 mg total) by mouth 3 (three) times daily as needed for dizziness. 08/30/16   Jacalyn Lefevre, MD  ondansetron  (ZOFRAN ODT) 4 MG disintegrating tablet Take 1 tablet (4 mg total) by mouth every 8 (eight) hours as needed for nausea or vomiting. 08/30/16   Jacalyn Lefevre, MD    Allergies    Citrus  Review of Systems   Review of Systems  Constitutional:  Negative for chills, fatigue and fever.  HENT:  Negative for ear pain and sore throat.   Eyes:  Negative for pain and visual disturbance.  Respiratory:  Negative for cough and shortness of breath.   Cardiovascular:  Negative for chest pain and palpitations.  Gastrointestinal:  Positive for abdominal pain. Negative for anorexia, constipation, diarrhea, flatus, melena, nausea and vomiting.  Genitourinary:  Negative for dysuria, hematuria, vaginal bleeding and vaginal discharge.  Musculoskeletal:  Negative for arthralgias and back pain.  Skin:  Negative for color change and rash.  Neurological:  Negative for seizures and syncope.  All other systems reviewed and are negative.  Physical Exam Updated Vital Signs BP (!) 142/95 (BP Location: Left Arm)   Pulse (!) 113   Temp 98.6 F (37 C) (Oral)   Resp (!) 24   Ht 5\' 4"  (1.626 m)  Wt (!) 214.1 kg   LMP 03/14/2021 (Approximate)   SpO2 97%   BMI 81.04 kg/m   Physical Exam Vitals and nursing note reviewed.  Constitutional:      General: She is not in acute distress.    Appearance: She is well-developed. She is not ill-appearing.  HENT:     Head: Normocephalic and atraumatic.     Mouth/Throat:     Mouth: Mucous membranes are moist.  Eyes:     Extraocular Movements: Extraocular movements intact.     Conjunctiva/sclera: Conjunctivae normal.     Pupils: Pupils are equal, round, and reactive to light.  Cardiovascular:     Rate and Rhythm: Normal rate and regular rhythm.     Pulses: Normal pulses.     Heart sounds: Normal heart sounds. No murmur heard. Pulmonary:     Effort: Pulmonary effort is normal. No respiratory distress.     Breath sounds: Normal breath sounds.  Abdominal:      Palpations: Abdomen is soft.     Tenderness: There is no abdominal tenderness.     Hernia: A hernia is present. Hernia is present in the ventral area (reduced at bedside, no overlying skin changes).  Musculoskeletal:     Cervical back: Neck supple.  Skin:    General: Skin is warm and dry.     Capillary Refill: Capillary refill takes less than 2 seconds.  Neurological:     General: No focal deficit present.     Mental Status: She is alert.    ED Results / Procedures / Treatments   Labs (all labs ordered are listed, but only abnormal results are displayed) Labs Reviewed  CBC WITH DIFFERENTIAL/PLATELET - Abnormal; Notable for the following components:      Result Value   Hemoglobin 11.9 (*)    All other components within normal limits  COMPREHENSIVE METABOLIC PANEL - Abnormal; Notable for the following components:   Glucose, Bld 110 (*)    Total Protein 8.4 (*)    All other components within normal limits  LIPASE, BLOOD    EKG None  Radiology CT ABDOMEN PELVIS WO CONTRAST  Result Date: 03/27/2021 CLINICAL DATA:  Suspected abdominal wall hernia 3-5 cm above umbilicus, reduced in ER EXAM: CT ABDOMEN AND PELVIS WITHOUT CONTRAST TECHNIQUE: Multidetector CT imaging of the abdomen and pelvis was performed following the standard protocol without IV contrast. Sagittal and coronal MPR images reconstructed from axial data set. COMPARISON:  None FINDINGS: Lower chest: Lung bases clear Hepatobiliary: Gallbladder surgically absent.  Liver unremarkable. Pancreas: Normal appearance Spleen: Normal appearance.  Small splenule at splenic hilum. Adrenals/Urinary Tract: Adrenal glands, kidneys, ureters, and decompressed bladder normal appearance Stomach/Bowel: Appendix not visualized. Stool in rectum. Stomach and bowel loops normal appearance. Vascular/Lymphatic: Few pelvic phleboliths. Atherosclerotic calcification aorta and iliac arteries. Reproductive: Enlarged uterus, lobulated appearance likely  containing multiple leiomyomata, largest exophytic from RIGHT fundus 9.9 x 6.5 x 7.0 cm. Other: No free air or free fluid. Supraumbilical ventral hernia LEFT parasagittal with fascial defect 2.2 x 1.7 cm, containing fat and minimal fluid no free air or free fluid. Musculoskeletal: Unremarkable IMPRESSION: LEFT parasagittal supraumbilical ventral hernia containing fat and minimal fluid with fascial defect measuring 2.2 x 1.7 cm. Enlarged lobulated uterus likely containing multiple leiomyomata, largest exophytic from RIGHT fundus 9.9 x 6.5 x 7.0 cm. Aortic Atherosclerosis (ICD10-I70.0). Electronically Signed   By: Ulyses Southward M.D.   On: 03/27/2021 09:10    Procedures Hernia reduction  Date/Time: 03/27/2021 8:27 AM Performed by:  Virgina Norfolk, DO Authorized by: Virgina Norfolk, DO  Consent: Verbal consent obtained. Risks and benefits: risks, benefits and alternatives were discussed Consent given by: patient Patient understanding: patient states understanding of the procedure being performed Patient consent: the patient's understanding of the procedure matches consent given Required items: required blood products, implants, devices, and special equipment available Patient identity confirmed: verbally with patient Time out: Immediately prior to procedure a "time out" was called to verify the correct patient, procedure, equipment, support staff and site/side marked as required. Preparation: Patient was prepped and draped in the usual sterile fashion. Local anesthesia used: no  Anesthesia: Local anesthesia used: no  Sedation: Patient sedated: no  Patient tolerance: patient tolerated the procedure well with no immediate complications     Medications Ordered in ED Medications - No data to display  ED Course  I have reviewed the triage vital signs and the nursing notes.  Pertinent labs & imaging results that were available during my care of the patient were reviewed by me and considered in my  medical decision making (see chart for details).    MDM Rules/Calculators/A&P                           ERCEL NORMOYLE is here with abdominal pain.  Feels like something sticking out of her abdomen.  She appears to have a ventral hernia on exam that was easily reducible.  Unremarkable vitals otherwise.  Pain greatly improved.  May be this was painful for the last day or 2.  She denies any constipation or heavy lifting.  CT scan confirmed left parasagittal supraumbilical ventral hernia containing fat and minimal fluid with fascial defect measuring 2.2 x 1.7 cm.  She also has some uterine fibroids.  Overall reexamination shows benign abdominal exam.  Vital signs showed no fever.  White count normal.  Overall suspect ventral hernia that was reduced.  We will have her follow-up with general surgery.  Discharged in good condition.  Understands return precautions.  This chart was dictated using voice recognition software.  Despite best efforts to proofread,  errors can occur which can change the documentation meaning.   Final Clinical Impression(s) / ED Diagnoses Final diagnoses:  Ventral hernia without obstruction or gangrene    Rx / DC Orders ED Discharge Orders     None        Virgina Norfolk, DO 03/27/21 [id number]

## 2021-03-27 NOTE — ED Notes (Signed)
PT stated she did not think we would need UA now. No UA Per Dr.Curatolo.

## 2021-03-27 NOTE — ED Triage Notes (Signed)
Patient reports abnormally extended menses, then noticed an abdominal protrusion. LBM x 2 days.

## 2021-03-27 NOTE — Discharge Instructions (Addendum)
Follow-up with general surgery as discussed.  If your hernia ever becomes difficult to reduce please return for evaluation.

## 2021-10-16 IMAGING — US US EXTREM LOW VENOUS*L*
1 series · 13 of 24 positions shown · non-contrast
Comparison: None.

CLINICAL DATA: Left calf swelling.

EXAM:
LEFT LOWER EXTREMITY VENOUS DOPPLER ULTRASOUND
TECHNIQUE: Gray-scale sonography with compression, as well as color and duplex
ultrasound, were performed to evaluate the deep venous system(s)
from the level of the common femoral vein through the popliteal and
proximal calf veins.

[Series 1: us extrem low venous*left* · 13 of 44 slices shown]
[im 1/44]
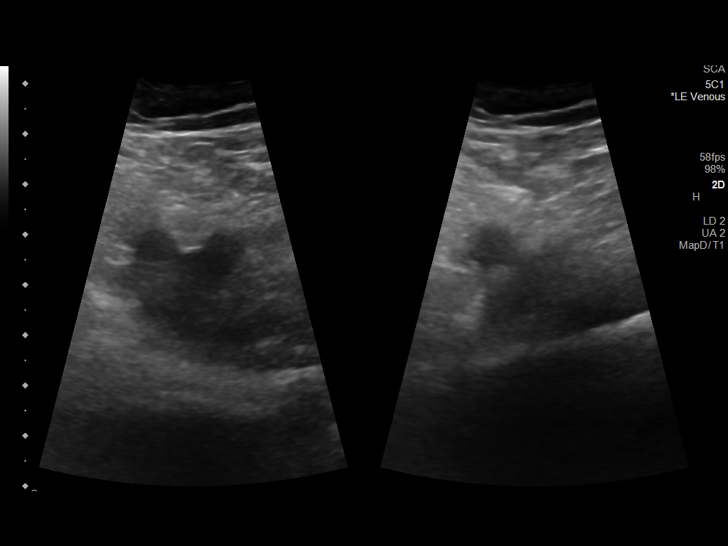
[im 4/44]
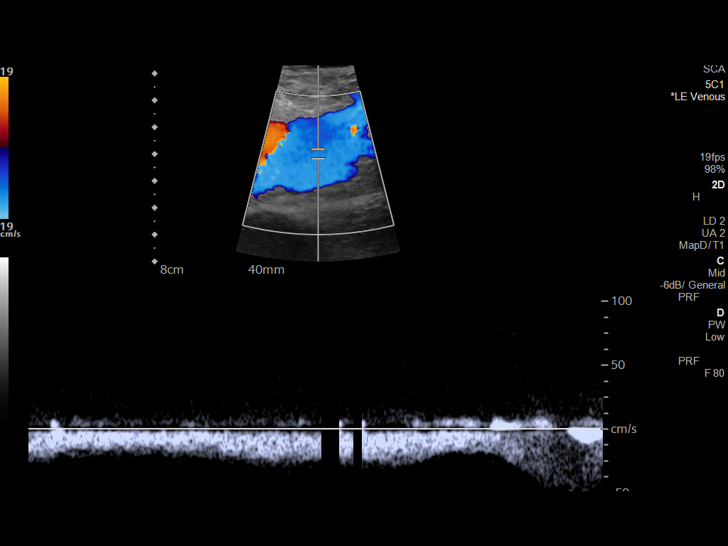
[im 8/44]
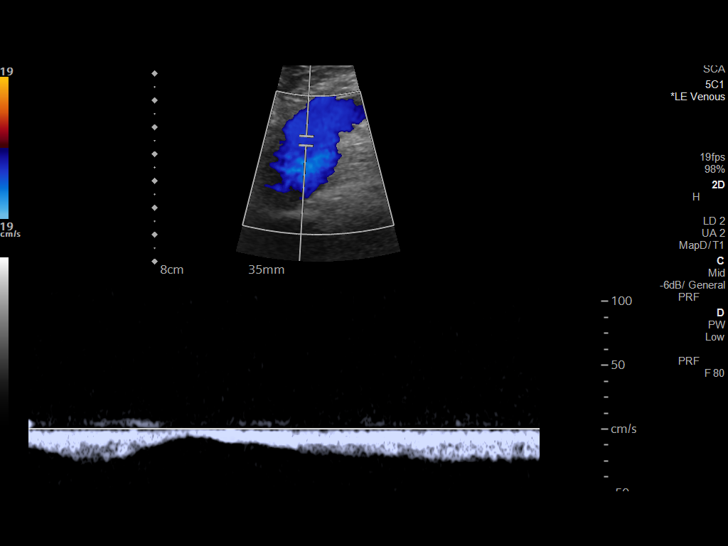
[im 12/44]
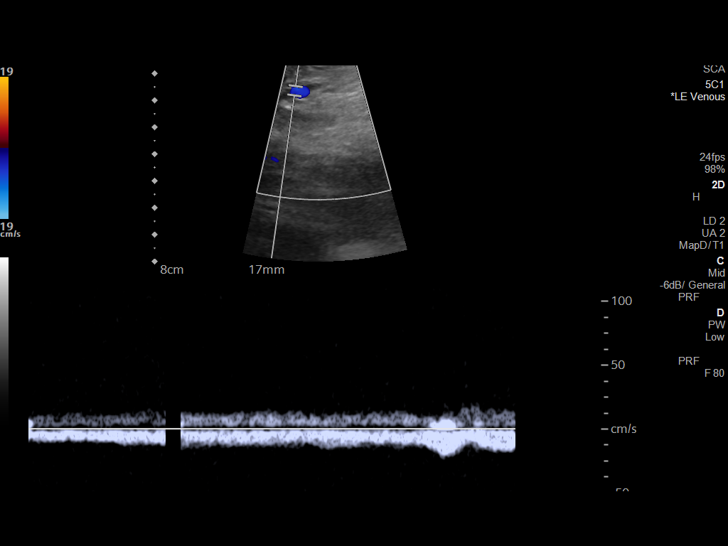
[im 15/44]
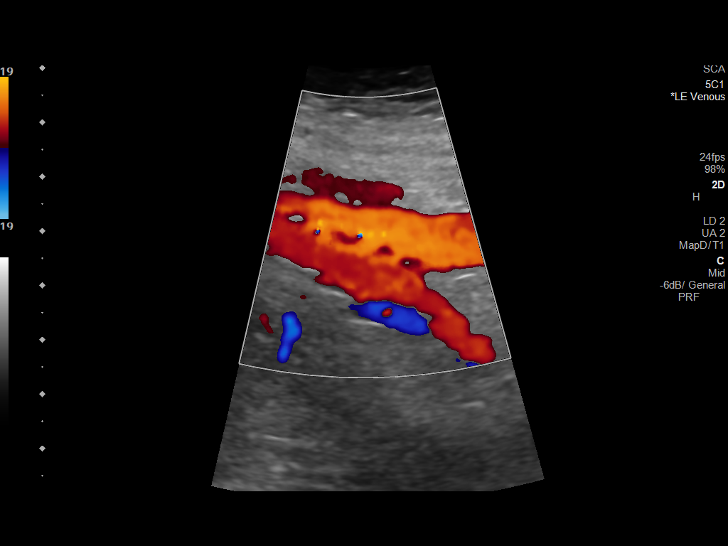
[im 19/44]
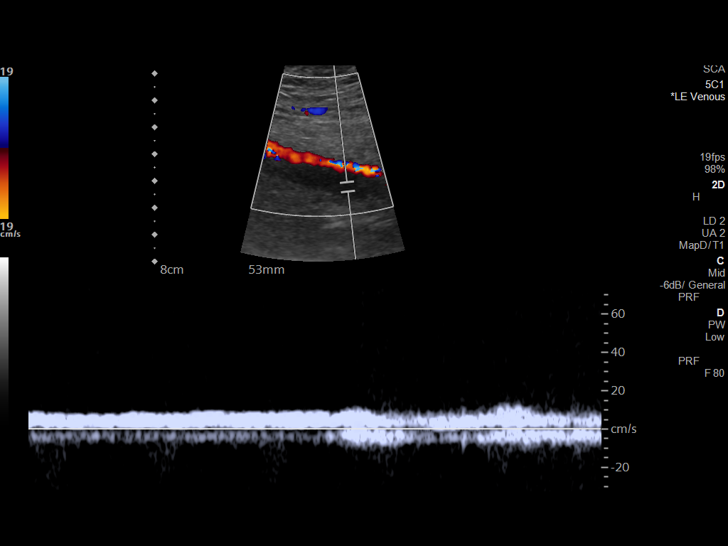
[im 23/44]
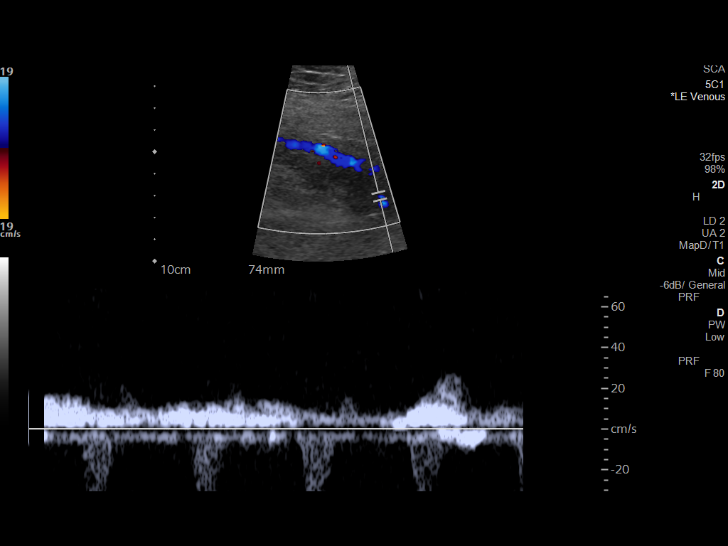
[im 25/44]
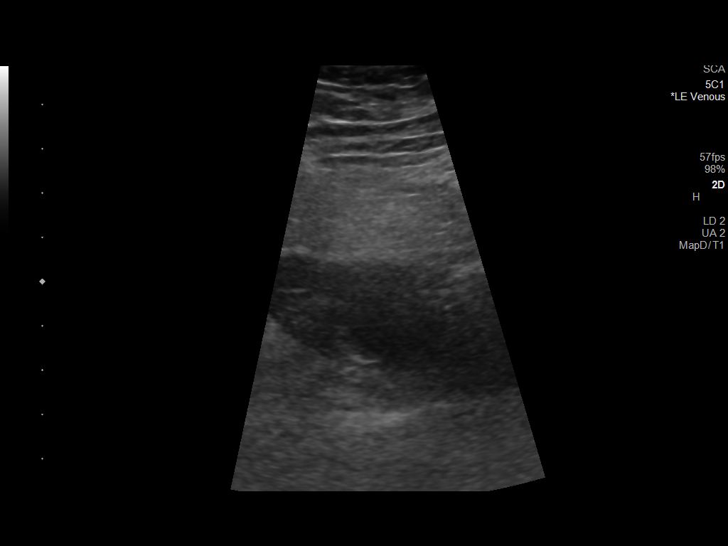
[im 29/44]
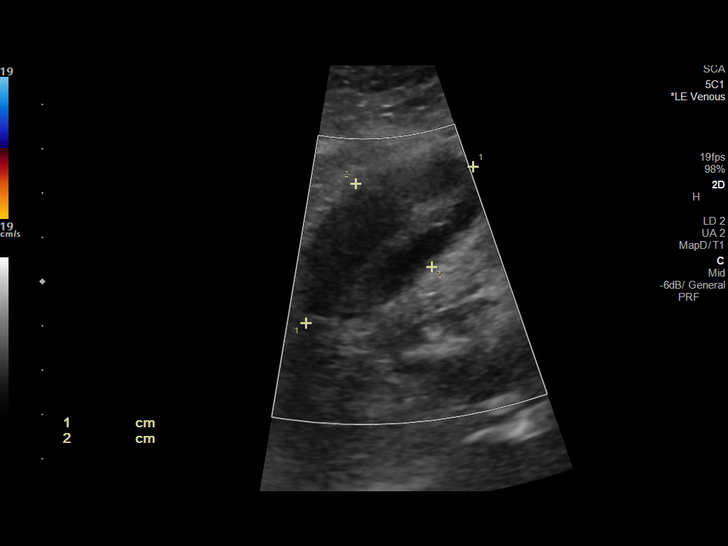
[im 32/44]
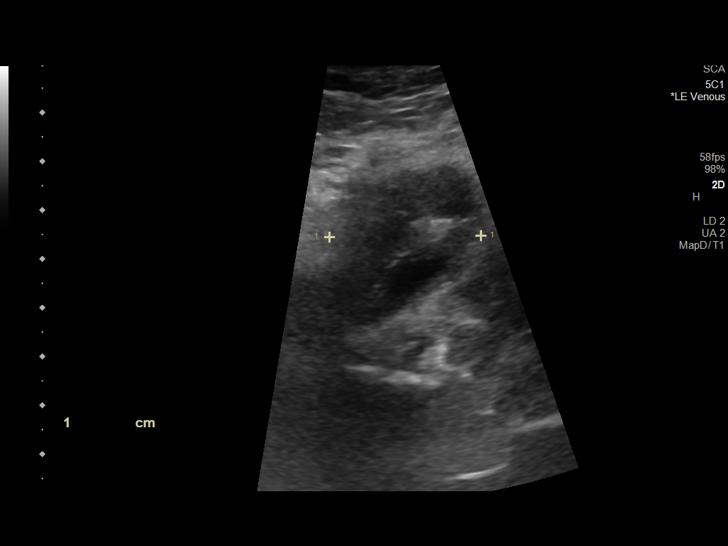
[im 36/44]
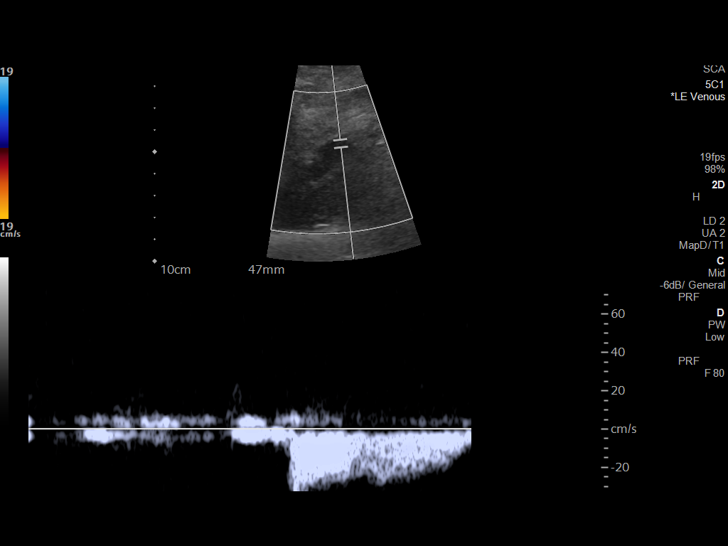
[im 40/44]
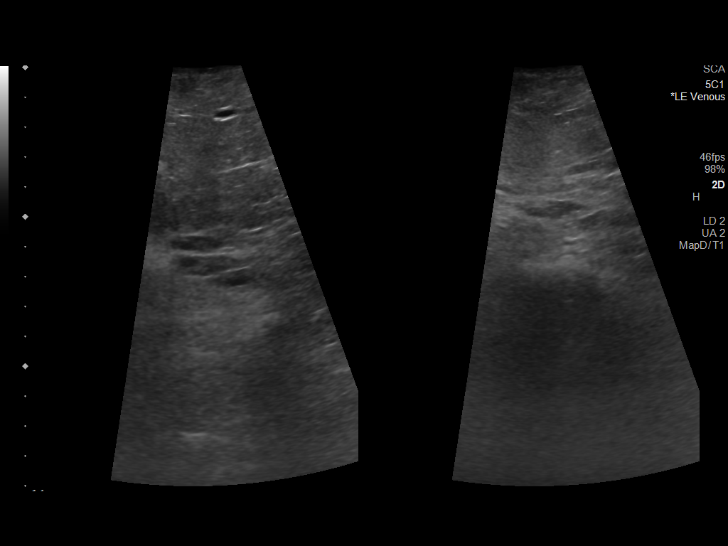
[im 44/44]
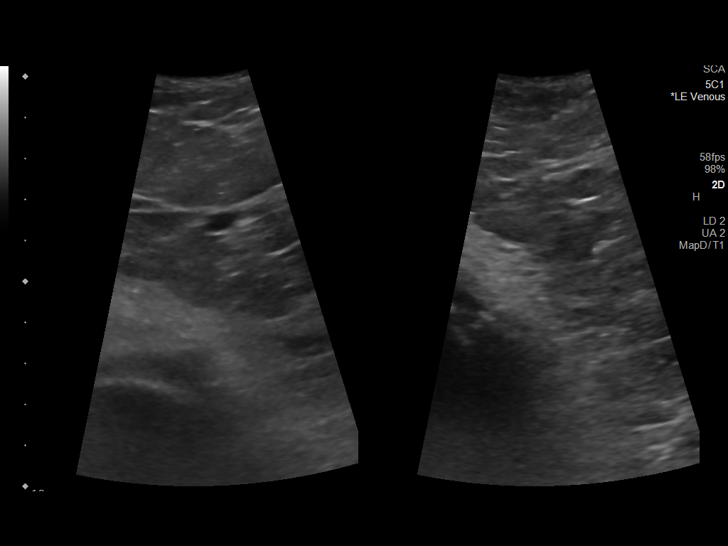

[13 of 24 positions shown; findings below may reference images not displayed]

FINDINGS: VENOUS

Normal compressibility of the common femoral, superficial femoral,
and popliteal veins, as well as the visualized calf veins.
Visualized portions of profunda femoral vein and great saphenous
vein unremarkable. No filling defects to suggest DVT on grayscale or
color Doppler imaging. Doppler waveforms show normal direction of
venous flow, normal respiratory plasticity and response to
augmentation.

Limited views of the contralateral common femoral vein are
unremarkable.

OTHER

Approximately 5.2 x 2.6 x 3.1 cm complex fluid collection in the
posterior and inferior thigh. No appreciable flow on Doppler
imaging.

Limitations: Limited evaluation of the calf veins due to patient
body habitus.
IMPRESSION: 1. No evidence of DVT with limited visualization of the calf veins
due to patient body habitus.
2. Nonspecific 5.2 cm complex fluid collection in the posterior and
inferior thigh.

## 2022-08-24 ENCOUNTER — Institutional Professional Consult (permissible substitution): Payer: No Typology Code available for payment source | Admitting: Plastic Surgery

## 2022-08-27 DIAGNOSIS — R1900 Intra-abdominal and pelvic swelling, mass and lump, unspecified site: Secondary | ICD-10-CM | POA: Insufficient documentation

## 2022-08-27 DIAGNOSIS — Z5321 Procedure and treatment not carried out due to patient leaving prior to being seen by health care provider: Secondary | ICD-10-CM | POA: Diagnosis not present

## 2022-08-27 MED ORDER — OXYCODONE-ACETAMINOPHEN 5-325 MG PO TABS
1.0000 | ORAL_TABLET | Freq: Once | ORAL | Status: AC
  Administered 2022-08-28: 1 via ORAL
  Filled 2022-08-27: qty 1

## 2022-08-27 NOTE — ED Provider Triage Note (Signed)
Emergency Medicine Provider Triage Evaluation Note  Carrie Herring , a 52 y.o. female  was evaluated in triage.  Pt complains of abdominal bulge. History of hernia that has had ot be reduced in ED in the past. Reports feeling the pressure come out this evening while sitting and doing work. No recent exercise or BM. Reports pain  Review of Systems  Positive:  Negative:   Physical Exam  BP (!) 145/87 (BP Location: Right Arm) Comment (BP Location): forearm  Pulse 81   Temp 97.8 F (36.6 C) (Oral)   Resp 18   SpO2 97%  Gen:   Awake, no distress   Resp:  Normal effort  MSK:   Moves extremities without difficulty  Other:  Ventral hernia just left to the umbilicus. Not reducible on PE.   Medical Decision Making  Medically screening exam initiated at 11:24 PM.  Appropriate orders placed.  Carrie Herring was informed that the remainder of the evaluation will be completed by another provider, this initial triage assessment does not replace that evaluation, and the importance of remaining in the ED until their evaluation is complete.       Rhae Hammock, PA-C 08/27/22 2332

## 2022-08-28 ENCOUNTER — Emergency Department (HOSPITAL_COMMUNITY)
Admission: EM | Admit: 2022-08-28 | Discharge: 2022-08-28 | Disposition: A | Discharge status: Home / Self Care | Payer: 59 | Attending: Student | Admitting: Student

## 2022-08-28 ENCOUNTER — Other Ambulatory Visit: Payer: Self-pay

## 2022-08-28 ENCOUNTER — Encounter (HOSPITAL_COMMUNITY): Payer: Self-pay

## 2022-08-28 ENCOUNTER — Emergency Department (HOSPITAL_COMMUNITY): Payer: 59

## 2022-08-28 LAB — COMPREHENSIVE METABOLIC PANEL
ALT: 17 U/L (ref 0–44)
AST: 21 U/L (ref 15–41)
Albumin: 4 g/dL (ref 3.5–5.0)
Alkaline Phosphatase: 53 U/L (ref 38–126)
Anion gap: 12 (ref 5–15)
BUN: 20 mg/dL (ref 6–20)
CO2: 23 mmol/L (ref 22–32)
Calcium: 9.3 mg/dL (ref 8.9–10.3)
Chloride: 102 mmol/L (ref 98–111)
Creatinine, Ser: 0.74 mg/dL (ref 0.44–1.00)
GFR, Estimated: >60 mL/min (ref >60)
Glucose, Bld: 105 mg/dL — ABNORMAL HIGH (ref 70–99)
Potassium: 4 mmol/L (ref 3.5–5.1)
Sodium: 137 mmol/L (ref 135–145)
Total Bilirubin: 0.8 mg/dL (ref 0.3–1.2)
Total Protein: 8.1 g/dL (ref 6.5–8.1)

## 2022-08-28 LAB — CBC WITH DIFFERENTIAL/PLATELET
Abs Immature Granulocytes: 0.04 10*3/uL (ref 0.00–0.07)
Basophils Absolute: 0 10*3/uL (ref 0.0–0.1)
Basophils Relative: 0 %
Eosinophils Absolute: 0.1 10*3/uL (ref 0.0–0.5)
Eosinophils Relative: 1 %
HCT: 39.2 % (ref 36.0–46.0)
Hemoglobin: 12.4 g/dL (ref 12.0–15.0)
Immature Granulocytes: 1 %
Lymphocytes Relative: 21 %
Lymphs Abs: 1.6 10*3/uL (ref 0.7–4.0)
MCH: 28.8 pg (ref 26.0–34.0)
MCHC: 31.6 g/dL (ref 30.0–36.0)
MCV: 91 fL (ref 80.0–100.0)
Monocytes Absolute: 0.5 10*3/uL (ref 0.1–1.0)
Monocytes Relative: 7 %
Neutro Abs: 5.3 10*3/uL (ref 1.7–7.7)
Neutrophils Relative %: 70 %
Platelets: 369 10*3/uL (ref 150–400)
RBC: 4.31 MIL/uL (ref 3.87–5.11)
RDW: 15.6 % — ABNORMAL HIGH (ref 11.5–15.5)
WBC: 7.5 10*3/uL (ref 4.0–10.5)
nRBC: 0 % (ref 0.0–0.2)

## 2022-08-28 MED ORDER — IOHEXOL 300 MG/ML  SOLN
100.0000 mL | Freq: Once | INTRAMUSCULAR | Status: AC | PRN
  Administered 2022-08-28: 100 mL via INTRAVENOUS

## 2022-08-28 MED ORDER — SODIUM CHLORIDE (PF) 0.9 % IJ SOLN
INTRAMUSCULAR | Status: AC
  Filled 2022-08-28: qty 50

## 2022-08-28 NOTE — ED Triage Notes (Signed)
Pt arrives with reports of abdominal hernia that popped out tonight. Pt reports she was just sitting and noticed it.

## 2022-08-30 ENCOUNTER — Other Ambulatory Visit: Payer: Self-pay | Admitting: Family Medicine

## 2022-08-30 DIAGNOSIS — N949 Unspecified condition associated with female genital organs and menstrual cycle: Secondary | ICD-10-CM

## 2022-08-31 ENCOUNTER — Other Ambulatory Visit: Payer: Self-pay | Admitting: Family Medicine

## 2022-08-31 DIAGNOSIS — R160 Hepatomegaly, not elsewhere classified: Secondary | ICD-10-CM

## 2022-09-21 ENCOUNTER — Ambulatory Visit
Admission: RE | Admit: 2022-09-21 | Discharge: 2022-09-21 | Disposition: A | Discharge status: Home / Self Care | Payer: No Typology Code available for payment source | Source: Ambulatory Visit | Attending: Family Medicine | Admitting: Family Medicine

## 2022-09-21 ENCOUNTER — Other Ambulatory Visit: Payer: No Typology Code available for payment source

## 2022-09-21 DIAGNOSIS — N949 Unspecified condition associated with female genital organs and menstrual cycle: Secondary | ICD-10-CM

## 2022-09-28 ENCOUNTER — Other Ambulatory Visit: Payer: Self-pay | Admitting: Obstetrics and Gynecology

## 2022-09-28 ENCOUNTER — Other Ambulatory Visit (HOSPITAL_COMMUNITY)
Admission: RE | Admit: 2022-09-28 | Discharge: 2022-09-28 | Disposition: A | Discharge status: Home / Self Care | Payer: No Typology Code available for payment source | Source: Ambulatory Visit | Attending: Obstetrics and Gynecology | Admitting: Obstetrics and Gynecology

## 2022-09-28 ENCOUNTER — Ambulatory Visit
Admission: RE | Admit: 2022-09-28 | Discharge: 2022-09-28 | Disposition: A | Discharge status: Home / Self Care | Payer: No Typology Code available for payment source | Source: Ambulatory Visit | Attending: Family Medicine | Admitting: Family Medicine

## 2022-09-28 DIAGNOSIS — Z124 Encounter for screening for malignant neoplasm of cervix: Secondary | ICD-10-CM | POA: Diagnosis present

## 2022-09-28 DIAGNOSIS — R160 Hepatomegaly, not elsewhere classified: Secondary | ICD-10-CM

## 2022-09-28 MED ORDER — GADOPICLENOL 0.5 MMOL/ML IV SOLN
10.0000 mL | Freq: Once | INTRAVENOUS | Status: AC | PRN
  Administered 2022-09-28: 10 mL via INTRAVENOUS

## 2022-10-02 LAB — CYTOLOGY - PAP
Comment: NEGATIVE
Diagnosis: NEGATIVE
High risk HPV: NEGATIVE

## 2022-11-20 ENCOUNTER — Institutional Professional Consult (permissible substitution): Payer: No Typology Code available for payment source | Admitting: Plastic Surgery

## 2023-02-12 ENCOUNTER — Institutional Professional Consult (permissible substitution): Payer: No Typology Code available for payment source | Admitting: Plastic Surgery

## 2023-06-14 ENCOUNTER — Institutional Professional Consult (permissible substitution): Payer: No Typology Code available for payment source | Admitting: Plastic Surgery

## 2023-08-27 ENCOUNTER — Institutional Professional Consult (permissible substitution): Payer: No Typology Code available for payment source | Admitting: Plastic Surgery

## 2023-10-29 ENCOUNTER — Institutional Professional Consult (permissible substitution): Payer: Self-pay | Admitting: Plastic Surgery

## 2023-12-10 ENCOUNTER — Institutional Professional Consult (permissible substitution): Admitting: Plastic Surgery

## 2024-01-20 ENCOUNTER — Telehealth: Payer: Self-pay | Admitting: Plastic Surgery

## 2024-01-20 NOTE — Telephone Encounter (Signed)
 provider out of office called 01-20-24 lvmail agin to try and r/s

## 2024-01-31 ENCOUNTER — Institutional Professional Consult (permissible substitution): Admitting: Plastic Surgery

## 2024-04-14 ENCOUNTER — Institutional Professional Consult (permissible substitution): Admitting: Plastic Surgery

## 2024-08-04 ENCOUNTER — Institutional Professional Consult (permissible substitution): Admitting: Plastic Surgery

## 2024-08-26 ENCOUNTER — Institutional Professional Consult (permissible substitution) (INDEPENDENT_AMBULATORY_CARE_PROVIDER_SITE_OTHER): Admitting: Family Medicine

## 2024-10-13 ENCOUNTER — Institutional Professional Consult (permissible substitution): Admitting: Plastic Surgery
